# Patient Record
Sex: Female | Born: 1970
Health system: Southern US, Community
[De-identification: ages and names within clinical notes are randomized; demographics above are authoritative.]

## PROBLEM LIST (undated history)

## (undated) DIAGNOSIS — I776 Arteritis, unspecified: Secondary | ICD-10-CM

## (undated) DIAGNOSIS — F419 Anxiety disorder, unspecified: Secondary | ICD-10-CM

## (undated) DIAGNOSIS — T7840XA Allergy, unspecified, initial encounter: Secondary | ICD-10-CM

## (undated) DIAGNOSIS — R011 Cardiac murmur, unspecified: Secondary | ICD-10-CM

## (undated) DIAGNOSIS — Z9889 Other specified postprocedural states: Secondary | ICD-10-CM

## (undated) DIAGNOSIS — R112 Nausea with vomiting, unspecified: Secondary | ICD-10-CM

## (undated) HISTORY — DX: Anxiety disorder, unspecified: F41.9

## (undated) HISTORY — DX: Arteritis, unspecified: I77.6

## (undated) HISTORY — DX: Nausea with vomiting, unspecified: Z98.890

## (undated) HISTORY — PX: EYE SURGERY: SHX253

## (undated) HISTORY — DX: Nausea with vomiting, unspecified: R11.2

## (undated) HISTORY — DX: Allergy, unspecified, initial encounter: T78.40XA

## (undated) HISTORY — DX: Cardiac murmur, unspecified: R01.1

## (undated) HISTORY — PX: WISDOM TOOTH EXTRACTION: SHX21

---

## 2002-09-21 ENCOUNTER — Other Ambulatory Visit: Admission: RE | Admit: 2002-09-21 | Discharge: 2002-09-21 | Payer: Self-pay | Admitting: Obstetrics and Gynecology

## 2002-12-28 ENCOUNTER — Inpatient Hospital Stay (HOSPITAL_COMMUNITY): Admission: AD | Admit: 2002-12-28 | Discharge: 2002-12-28 | Payer: Self-pay | Admitting: Obstetrics and Gynecology

## 2003-02-28 ENCOUNTER — Inpatient Hospital Stay (HOSPITAL_COMMUNITY): Admission: AD | Admit: 2003-02-28 | Discharge: 2003-02-28 | Payer: Self-pay | Admitting: Obstetrics and Gynecology

## 2003-05-28 ENCOUNTER — Inpatient Hospital Stay (HOSPITAL_COMMUNITY): Admission: AD | Admit: 2003-05-28 | Discharge: 2003-05-30 | Payer: Self-pay | Admitting: Obstetrics and Gynecology

## 2003-10-03 ENCOUNTER — Other Ambulatory Visit: Admission: RE | Admit: 2003-10-03 | Discharge: 2003-10-03 | Payer: Self-pay | Admitting: Obstetrics and Gynecology

## 2003-12-18 ENCOUNTER — Emergency Department (HOSPITAL_COMMUNITY): Admission: EM | Admit: 2003-12-18 | Discharge: 2003-12-18 | Payer: Self-pay | Admitting: Emergency Medicine

## 2004-03-28 ENCOUNTER — Emergency Department (HOSPITAL_COMMUNITY): Admission: EM | Admit: 2004-03-28 | Discharge: 2004-03-28 | Payer: Self-pay | Admitting: Emergency Medicine

## 2005-11-17 ENCOUNTER — Other Ambulatory Visit: Admission: RE | Admit: 2005-11-17 | Discharge: 2005-11-17 | Payer: Self-pay | Admitting: Obstetrics and Gynecology

## 2012-02-24 ENCOUNTER — Ambulatory Visit: Payer: Self-pay | Admitting: Obstetrics and Gynecology

## 2012-03-22 ENCOUNTER — Encounter: Payer: Self-pay | Admitting: Obstetrics and Gynecology

## 2012-03-22 ENCOUNTER — Ambulatory Visit: Payer: BC Managed Care – PPO | Admitting: Obstetrics and Gynecology

## 2012-03-22 VITALS — BP 118/70 | Ht 66.0 in | Wt 140.0 lb

## 2012-03-22 DIAGNOSIS — Z01419 Encounter for gynecological examination (general) (routine) without abnormal findings: Secondary | ICD-10-CM

## 2012-03-22 DIAGNOSIS — I776 Arteritis, unspecified: Secondary | ICD-10-CM | POA: Insufficient documentation

## 2012-03-22 NOTE — Progress Notes (Signed)
The patient reports:no complaints   Contraception:Condoms  Last mammogram: 10/02/2007 Normal Last pap: 02/23/2011 Normal  GC/Chlamydia cultures offered: declined HIV/RPR/HbsAg offered:  declined HSV 1 and 2 glycoprotein offered: declined  Menstrual cycle regular and monthly: Yes evey 28 days Menstrual flow normal: Yes lasts 5-7 days   Urinary symptoms: none Normal bowel movements: Yes Reports abuse at home: No:   Subjective:    Gina Melton is a 42 y.o. female, G2P1011, who presents for an annual exam.with no complaints.    History   Social History  . Marital Status: Single    Spouse Name: N/A    Number of Children: N/A  . Years of Education: N/A   Social History Main Topics  . Smoking status: Former Games developer  . Smokeless tobacco: Never Used  . Alcohol Use: No  . Drug Use: No  . Sexually Active: Yes    Birth Control/ Protection: Condom   Other Topics Concern  . Not on file   Social History Narrative  . No narrative on file    Menstrual cycle:   LMP: Patient's last menstrual period was 03/03/2012.           Cycle: normal  The following portions of the patient's history were reviewed and updated as appropriate: allergies, current medications, past family history, past medical history, past social history, past surgical history and problem list.  Review of Systems Pertinent items are noted in HPI. Breast:Negative for breast lump,nipple discharge or nipple retraction Gastrointestinal: Negative for abdominal pain, change in bowel habits or rectal bleeding Urinary:negative   Objective:    BP 118/70  Ht 5\' 6"  (1.676 m)  Wt 140 lb (63.504 kg)  BMI 22.61 kg/m2  LMP 03/03/2012    Weight:  Wt Readings from Last 1 Encounters:  03/22/12 140 lb (63.504 kg)          BMI: Body mass index is 22.61 kg/(m^2).  General Appearance: Alert, appropriate appearance for age. No acute distress HEENT: Grossly normal Neck / Thyroid: Supple, no masses, nodes or enlargement Lungs:  clear to auscultation bilaterally Back: No CVA tenderness Breast Exam: No masses or nodes.No dimpling, nipple retraction or discharge. Cardiovascular: Regular rate and rhythm. S1, S2, no murmur Gastrointestinal: Soft, non-tender, no masses or organomegaly Pelvic Exam: Vulva and vagina appear normal. Bimanual exam reveals normal uterus and adnexa. Rectovaginal: normal rectal, no masses Lymphatic Exam: Non-palpable nodes in neck, clavicular, axillary, or inguinal regions  Skin: no rash or abnormalities Neurologic: Normal gait and speech, no tremor  Psychiatric: Alert and oriented, appropriate affect.   Assessment:    Normal gyn exam    Plan:    Mammogram  pap smear return annually or prn STD screening: declined Contraception:condoms Recent diagnosis of vasculitis discussed Sch MMG @ [facility] Some peri-rectal irritation, rx discussed / pt declined  Silverio Lay MD

## 2012-03-23 LAB — PAP IG W/ RFLX HPV ASCU

## 2012-05-29 ENCOUNTER — Other Ambulatory Visit: Payer: Self-pay

## 2012-05-29 DIAGNOSIS — Z1231 Encounter for screening mammogram for malignant neoplasm of breast: Secondary | ICD-10-CM

## 2012-06-01 ENCOUNTER — Ambulatory Visit
Admission: RE | Admit: 2012-06-01 | Discharge: 2012-06-01 | Disposition: A | Discharge status: Home / Self Care | Payer: BC Managed Care – PPO | Source: Ambulatory Visit

## 2012-06-01 DIAGNOSIS — Z1231 Encounter for screening mammogram for malignant neoplasm of breast: Secondary | ICD-10-CM

## 2012-06-05 ENCOUNTER — Other Ambulatory Visit: Payer: Self-pay | Admitting: Obstetrics and Gynecology

## 2012-06-05 DIAGNOSIS — R928 Other abnormal and inconclusive findings on diagnostic imaging of breast: Secondary | ICD-10-CM

## 2012-06-14 ENCOUNTER — Ambulatory Visit
Admission: RE | Admit: 2012-06-14 | Discharge: 2012-06-14 | Disposition: A | Discharge status: Home / Self Care | Payer: BC Managed Care – PPO | Source: Ambulatory Visit | Attending: Obstetrics and Gynecology | Admitting: Obstetrics and Gynecology

## 2012-06-14 DIAGNOSIS — R928 Other abnormal and inconclusive findings on diagnostic imaging of breast: Secondary | ICD-10-CM

## 2013-04-09 ENCOUNTER — Other Ambulatory Visit: Payer: Self-pay | Admitting: Obstetrics and Gynecology

## 2013-04-09 DIAGNOSIS — Z1231 Encounter for screening mammogram for malignant neoplasm of breast: Secondary | ICD-10-CM

## 2013-06-04 ENCOUNTER — Ambulatory Visit
Admission: RE | Admit: 2013-06-04 | Discharge: 2013-06-04 | Disposition: A | Discharge status: Home / Self Care | Payer: BC Managed Care – PPO | Source: Ambulatory Visit | Attending: Obstetrics and Gynecology | Admitting: Obstetrics and Gynecology

## 2013-06-04 DIAGNOSIS — Z1231 Encounter for screening mammogram for malignant neoplasm of breast: Secondary | ICD-10-CM

## 2013-07-31 ENCOUNTER — Other Ambulatory Visit (HOSPITAL_COMMUNITY): Payer: Self-pay | Admitting: Obstetrics and Gynecology

## 2013-07-31 DIAGNOSIS — Z308 Encounter for other contraceptive management: Secondary | ICD-10-CM

## 2013-09-10 ENCOUNTER — Other Ambulatory Visit (HOSPITAL_COMMUNITY): Payer: Self-pay | Admitting: Obstetrics and Gynecology

## 2013-09-10 ENCOUNTER — Ambulatory Visit (HOSPITAL_COMMUNITY)
Admission: RE | Admit: 2013-09-10 | Discharge: 2013-09-10 | Disposition: A | Discharge status: Home / Self Care | Payer: BC Managed Care – PPO | Source: Ambulatory Visit | Attending: Obstetrics and Gynecology | Admitting: Obstetrics and Gynecology

## 2013-09-10 DIAGNOSIS — Z308 Encounter for other contraceptive management: Secondary | ICD-10-CM | POA: Insufficient documentation

## 2013-09-10 MED ORDER — IOHEXOL 300 MG/ML  SOLN
20.0000 mL | Freq: Once | INTRAMUSCULAR | Status: AC | PRN
  Administered 2013-09-10: 20 mL

## 2013-12-03 ENCOUNTER — Encounter: Payer: Self-pay | Admitting: Obstetrics and Gynecology

## 2016-11-02 ENCOUNTER — Ambulatory Visit (INDEPENDENT_AMBULATORY_CARE_PROVIDER_SITE_OTHER): Payer: Managed Care, Other (non HMO) | Admitting: Neurology

## 2016-11-02 ENCOUNTER — Encounter (INDEPENDENT_AMBULATORY_CARE_PROVIDER_SITE_OTHER): Payer: Self-pay

## 2016-11-02 ENCOUNTER — Encounter: Payer: Self-pay | Admitting: Neurology

## 2016-11-02 VITALS — BP 115/62 | HR 64 | Ht 66.0 in | Wt 144.2 lb

## 2016-11-02 DIAGNOSIS — G5731 Lesion of lateral popliteal nerve, right lower limb: Secondary | ICD-10-CM

## 2016-11-02 NOTE — Progress Notes (Signed)
[facility]    Provider:  Dr Jaynee Eagles Referring Provider: Lahoma Rocker, MD Primary Care Physician:  Donald Prose, MD  CC:  neuropathy  HPI:  Gina Melton is a 46 y.o. female here as a referral from Dr. Kathlene November for Peroneal neuropathy. 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 Past medical history of arthritis, vasculitis, anxiety, uveitis, sicca syndrome, keratitis, tingling in the right foot. The Peroneal sensory neuropathy started in May with numbness, on the top of the foot. 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 She doesn't even notice it, only since she touched it. No pain, no weakness, stable not progressive, No paresthesias just numbness. It is on the right sock. Patient crosses her legs a lot and also sits with her legs flexed which also may compress the peroneal nerve at the fibular head. No back pain or radicular symptoms. No other focal neurologic deficits, associated symptoms, inciting events or modifiable factors.    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 She sitting and ophthalmologist. And rheumatologist. Reviewed notes from [facility]. She is on methotrexate and Humira. She reported tingling in her right dorsal aspect of the foot for last 1-2 months. No decrease in muscle strength or any foot drop or any other areas of neurologic symptoms.  CMP performed with BUN 11, creatinine 0.68. ESR 11. CRP normal. Personally reviewed EMG EEG consultants on [employer] this is an abnormal study with electrophysiologic evidence of a right peroneal  sensory neuropathy, no evidence appeared motor abnormality, or radiculopathy plexopathy or myopathy.    Review of Systems: Patient complains of symptoms per HPI as well as the following symptoms: numbness. Pertinent negatives and positives per HPI. All others negative.   Social History   Social History  . Marital status: Married    Spouse name: N/A  . Number of children: N/A  . Years of education: N/A   Occupational History  . Not on file.   Social History Main Topics  . Smoking status: Former Research scientist (life sciences)  . Smokeless tobacco: Never Used  . Alcohol use No  . Drug use: No  . Sexual activity: Yes    Birth control/ protection: Condom   Other Topics Concern  . Not on file   Social History Narrative  . No narrative on file    Family History  Problem Relation Age of Onset  . Breast cancer Mother   . Cancer Father        lung  . Cancer Maternal Grandfather        colon  . Breast cancer Other   . Diabetes Paternal Grandfather   . Neuropathy Neg Hx     Past Medical History:  Diagnosis Date  . Allergy   . Anxiety    Panic Attacks  . Heart murmur   . PONV (postoperative nausea and vomiting)   . Vasculitis [facility])     Past Surgical History:  Procedure Laterality Date  . EYE SURGERY    . WISDOM TOOTH EXTRACTION      Current Outpatient Prescriptions  Medication Sig Dispense Refill  . calcium citrate-vitamin D 200-200 MG-UNIT TABS Take 1 tablet by mouth daily.    Marland Kitchen  cetirizine (ZYRTEC) 10 MG tablet Take 10 mg by mouth daily.    . Cranberry 400 MG CAPS Take by mouth.    . diclofenac sodium (VOLTAREN) 1 % GEL Voltaren 1 % topical gel  APPLY 3 GRAMS TO 3 LARGE JOINTS UP TO THREE TIMES A DAY AS NEEDED    . escitalopram (LEXAPRO) 10 MG tablet Take 10 mg by mouth daily.    . fish oil-omega-3 fatty acids 1000 MG capsule Take 2 g by mouth 2 (two) times daily.    . folic acid (FOLVITE) 1 MG tablet Take 1 mg by mouth 2 (two) times daily.    Marland Kitchen glucosamine-chondroitin 500-400 MG  tablet Take 1 tablet by mouth 2 (two) times daily.    Marland Kitchen HUMIRA PEN 40 MG/0.8ML PNKT once a week.     . loratadine (CLARITIN) 10 MG tablet Take 10 mg by mouth daily.    . methotrexate (50 MG/ML) 1 g injection Inject 0.8 mg into the vein once a week.    . Multiple Vitamin (MULTIVITAMIN) tablet Take 1 tablet by mouth daily.    . vitamin C (ASCORBIC ACID) 500 MG tablet Take 500 mg by mouth daily.     No current facility-administered medications for this visit.     Allergies as of 11/02/2016 - Review Complete 11/02/2016  Allergen Reaction Noted  . Flagyl [metronidazole] Hives 03/20/2012  . Floxin [ofloxacin] Other (See Comments), Hives, and Itching 03/20/2012  . Other Hives   . Demerol [meperidine] Other (See Comments) 03/20/2012  . Nsaids      Vitals: BP 115/62 (BP Location: Right Arm, Patient Position: Sitting, Cuff Size: Small)   Pulse 64   Ht 5' 6"  (1.676 m)   Wt 144 lb 3.2 oz (65.4 kg)   BMI 23.27 kg/m  Last Weight:  Wt Readings from Last 1 Encounters:  11/02/16 144 lb 3.2 oz (65.4 kg)   Last Height:   Ht Readings from Last 1 Encounters:  11/02/16 5' 6"  (1.676 m)    Physical exam: Exam: Gen: NAD, conversant, well nourised, obese, well groomed                     CV: RRR, no MRG. No Carotid Bruits. No peripheral edema, warm, nontender Eyes: Conjunctivae injection left eye  Neuro: Detailed Neurologic Exam  Speech:    Speech is normal; fluent and spontaneous with normal comprehension.  Cognition:    The patient is oriented to person, place, and time;     recent and remote memory intact;     language fluent;     normal attention, concentration,     fund of knowledge Cranial Nerves:    Left pupil assymetric minimally reactive. Attempted funduscopic exam could not visualize Visual fields are full to finger confrontation. Right eye impairment inversion, left eye impairment of vertical and horizontal eye movements, disconjugate gaze. Trigeminal sensation is intact and the  muscles of mastication are normal. The face is symmetric. The palate elevates in the midline. Hearing intact. Voice is normal. Shoulder shrug is normal. The tongue has normal motion without fasciculations.   Coordination:    No dysmetria  Gait:    No ataxia  Motor Observation:    No asymmetry, no atrophy, and no involuntary movements noted. Tone:    Normal muscle tone.    Posture:    Posture is normal. normal erect    Strength:    Strength is V/V in the upper and lower limbs.  Sensation: Decreased in the right leg in the distribution of the peroneal sensory nerve     Reflex Exam:  DTR's:    Deep tendon reflexes in the upper and lower extremities are normal bilaterally.   Toes:    The toes are downgoing bilaterally.   Clonus:    Clonus is absent.      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 At this time conservative measures should be followed including positioning herself where she does not irritate the peroneal nerve as it does not crossing legs, no need for biopsy at this time continue being treated for her vasculitis with rheumatology, do not wear tight boots or tight shoes. Patient is return to clinic if symptoms worsen. In many cases symptoms will resolve however takes significant time for nerves to regenerate.  Cc: Lahoma Rocker, MD  Sarina Ill, MD  [facility] [address] [place], [place] [address]  Phone [phone] Fax [phone]

## 2016-11-02 NOTE — Patient Instructions (Signed)
Common Peroneal Nerve Entrapment Common peroneal nerve entrapment is a condition that can make it hard to lift a foot. The condition results from pressure on a nerve in the lower leg called the common peroneal nerve. Your common peroneal nerve provides feeling to your outer lower leg and foot. It also supplies the muscles that move your foot and toes upward and outward. What are the causes? This condition may be caused by:  A hard, direct hit to the side of the lower leg.  Swelling from a knee injury.  A break (fracture) in one of the lower leg bones.  Wearing a boot or cast that ends just below the knee.  A growth or cyst near the nerve.  What increases the risk? This condition is more likely to develop in people who play:  Contact sports, such as football or hockey.  Sports in which you wear high and stiff boots, such as skiing.  What are the signs or symptoms? Symptoms of this condition include:  Trouble lifting your foot up.  Tripping often.  Your foot hitting the ground harder than normal as you walk.  Numbness, tingling, or pain in the outside of the knee, outside of the lower leg, and top of the foot.  Sensitivity to pressure on the front or side of the leg.  How is this diagnosed? This condition may be diagnosed based on:  Your symptoms.  Your medical history.  A physical exam.  Tests, such as: ? An X-ray to check the bones of your knee and leg. ? MRI to check tendons that attach to the side of your knee. ? An electromyogram (EMG) to check your nerves.  During your physical exam, your health care provider will check for numbness in your leg and test the strength of your lower leg muscles. He or she may tap the side of your lower leg to see if that causes tingling. How is this treated? Treatment for this condition may include:  Avoiding activities that make symptoms worse.  Using a brace to hold up your foot and toes.  Taking anti-inflammatory pain  medicines to relieve swelling and reduce pain.  Having medicines injected into your ankle joint to reduce pain and swelling.  Physical therapy. This involves doing exercises.  Returning gradually to full activity.  Surgery to take pressure off the nerve. This may be needed if there is no improvement after 2-3 months or if there is a growth pushing on the nerve.  Follow these instructions at home: If you have a brace:  Wear it as told by your health care provider. Remove it only as told by your health care provider.  Loosen the brace if your toes tingle, become numb, or turn cold and blue.  Keep the brace clean.  If the brace is not waterproof: ? Do not let it get wet. ? Cover it with a watertight covering when you take a bath or a shower.  Ask your health care provider when it is safe to drive with a brace on your foot. Activity  Return to your normal activities as told by your health care provider. Ask your health care provider what activities are safe for you.  Do not do any activities that make pain or swelling worse.  Do exercises as told by your health care provider. General instructions  Take over-the-counter and prescription medicines only as told by your health care provider.  Do not put your full weight on your knee until your health care provider   says you can.  Keep all follow-up visits as told by your health care provider. This is important. How is this prevented?  Wear supportive footwear that is appropriate for your athletic activity.  Avoid athletic activities that cause ankle pain or swelling.  Wear protective padding over your lower legs when playing contact sports.  Make sure your boots do not put extra pressure on the area just below your knees.  Do not sit cross-legged for long periods of time. Contact a health care provider if:  Your symptoms do not get better in 2-3 months.  The weakness or numbness in your leg or foot gets worse. This  information is not intended to replace advice given to you by your health care provider. Make sure you discuss any questions you have with your health care provider. Document Released: 01/18/2005 Document Revised: 09/23/2015 Document Reviewed: 12/06/2014 Elsevier Interactive Patient Education  2018 Elsevier Inc.  

## 2018-08-17 DIAGNOSIS — Z3040 Encounter for surveillance of contraceptives, unspecified: Secondary | ICD-10-CM | POA: Diagnosis not present

## 2018-08-17 DIAGNOSIS — Z1231 Encounter for screening mammogram for malignant neoplasm of breast: Secondary | ICD-10-CM | POA: Diagnosis not present

## 2018-08-17 DIAGNOSIS — Z6827 Body mass index (BMI) 27.0-27.9, adult: Secondary | ICD-10-CM | POA: Diagnosis not present

## 2018-08-17 DIAGNOSIS — Z01419 Encounter for gynecological examination (general) (routine) without abnormal findings: Secondary | ICD-10-CM | POA: Diagnosis not present

## 2018-08-29 DIAGNOSIS — E7800 Pure hypercholesterolemia, unspecified: Secondary | ICD-10-CM | POA: Diagnosis not present

## 2018-08-29 DIAGNOSIS — F3181 Bipolar II disorder: Secondary | ICD-10-CM | POA: Diagnosis not present

## 2018-08-29 DIAGNOSIS — F411 Generalized anxiety disorder: Secondary | ICD-10-CM | POA: Diagnosis not present

## 2018-08-29 DIAGNOSIS — Z0000 Encounter for general adult medical examination without abnormal findings: Secondary | ICD-10-CM | POA: Diagnosis not present

## 2018-09-12 DIAGNOSIS — H16102 Unspecified superficial keratitis, left eye: Secondary | ICD-10-CM | POA: Diagnosis not present

## 2018-09-12 DIAGNOSIS — I776 Arteritis, unspecified: Secondary | ICD-10-CM | POA: Diagnosis not present

## 2018-09-12 DIAGNOSIS — H5010 Unspecified exotropia: Secondary | ICD-10-CM | POA: Diagnosis not present

## 2018-10-03 DIAGNOSIS — Z23 Encounter for immunization: Secondary | ICD-10-CM | POA: Diagnosis not present

## 2018-10-03 DIAGNOSIS — Z79899 Other long term (current) drug therapy: Secondary | ICD-10-CM | POA: Diagnosis not present

## 2018-10-03 DIAGNOSIS — H209 Unspecified iridocyclitis: Secondary | ICD-10-CM | POA: Diagnosis not present

## 2018-10-03 DIAGNOSIS — H169 Unspecified keratitis: Secondary | ICD-10-CM | POA: Diagnosis not present

## 2018-10-03 DIAGNOSIS — M3501 Sicca syndrome with keratoconjunctivitis: Secondary | ICD-10-CM | POA: Diagnosis not present

## 2018-11-13 DIAGNOSIS — I776 Arteritis, unspecified: Secondary | ICD-10-CM | POA: Diagnosis not present

## 2018-11-13 DIAGNOSIS — H5010 Unspecified exotropia: Secondary | ICD-10-CM | POA: Diagnosis not present

## 2018-11-13 DIAGNOSIS — H53033 Strabismic amblyopia, bilateral: Secondary | ICD-10-CM | POA: Diagnosis not present

## 2018-11-13 DIAGNOSIS — H16102 Unspecified superficial keratitis, left eye: Secondary | ICD-10-CM | POA: Diagnosis not present

## 2019-01-03 DIAGNOSIS — Z79899 Other long term (current) drug therapy: Secondary | ICD-10-CM | POA: Diagnosis not present

## 2019-01-03 DIAGNOSIS — M3501 Sicca syndrome with keratoconjunctivitis: Secondary | ICD-10-CM | POA: Diagnosis not present

## 2019-01-03 DIAGNOSIS — H169 Unspecified keratitis: Secondary | ICD-10-CM | POA: Diagnosis not present

## 2019-01-03 DIAGNOSIS — H209 Unspecified iridocyclitis: Secondary | ICD-10-CM | POA: Diagnosis not present

## 2019-02-16 DIAGNOSIS — H53033 Strabismic amblyopia, bilateral: Secondary | ICD-10-CM | POA: Diagnosis not present

## 2019-02-16 DIAGNOSIS — H5010 Unspecified exotropia: Secondary | ICD-10-CM | POA: Diagnosis not present

## 2019-02-16 DIAGNOSIS — I776 Arteritis, unspecified: Secondary | ICD-10-CM | POA: Diagnosis not present

## 2019-02-16 DIAGNOSIS — H16102 Unspecified superficial keratitis, left eye: Secondary | ICD-10-CM | POA: Diagnosis not present

## 2019-04-03 DIAGNOSIS — M3501 Sicca syndrome with keratoconjunctivitis: Secondary | ICD-10-CM | POA: Diagnosis not present

## 2019-04-03 DIAGNOSIS — H209 Unspecified iridocyclitis: Secondary | ICD-10-CM | POA: Diagnosis not present

## 2019-04-03 DIAGNOSIS — Z79899 Other long term (current) drug therapy: Secondary | ICD-10-CM | POA: Diagnosis not present

## 2019-04-03 DIAGNOSIS — H169 Unspecified keratitis: Secondary | ICD-10-CM | POA: Diagnosis not present

## 2019-06-22 DIAGNOSIS — I776 Arteritis, unspecified: Secondary | ICD-10-CM | POA: Diagnosis not present

## 2019-06-22 DIAGNOSIS — H2700 Aphakia, unspecified eye: Secondary | ICD-10-CM | POA: Diagnosis not present

## 2019-06-22 DIAGNOSIS — H5010 Unspecified exotropia: Secondary | ICD-10-CM | POA: Diagnosis not present

## 2019-06-22 DIAGNOSIS — H16102 Unspecified superficial keratitis, left eye: Secondary | ICD-10-CM | POA: Diagnosis not present

## 2019-07-04 DIAGNOSIS — H209 Unspecified iridocyclitis: Secondary | ICD-10-CM | POA: Diagnosis not present

## 2019-07-04 DIAGNOSIS — H169 Unspecified keratitis: Secondary | ICD-10-CM | POA: Diagnosis not present

## 2019-07-04 DIAGNOSIS — M3501 Sicca syndrome with keratoconjunctivitis: Secondary | ICD-10-CM | POA: Diagnosis not present

## 2019-07-04 DIAGNOSIS — Z79899 Other long term (current) drug therapy: Secondary | ICD-10-CM | POA: Diagnosis not present

## 2019-08-03 DIAGNOSIS — H16102 Unspecified superficial keratitis, left eye: Secondary | ICD-10-CM | POA: Diagnosis not present

## 2019-08-03 DIAGNOSIS — H5010 Unspecified exotropia: Secondary | ICD-10-CM | POA: Diagnosis not present

## 2019-08-03 DIAGNOSIS — I776 Arteritis, unspecified: Secondary | ICD-10-CM | POA: Diagnosis not present

## 2019-08-13 DIAGNOSIS — Z1231 Encounter for screening mammogram for malignant neoplasm of breast: Secondary | ICD-10-CM | POA: Diagnosis not present

## 2019-08-13 DIAGNOSIS — Z1211 Encounter for screening for malignant neoplasm of colon: Secondary | ICD-10-CM | POA: Diagnosis not present

## 2019-08-13 DIAGNOSIS — Z01419 Encounter for gynecological examination (general) (routine) without abnormal findings: Secondary | ICD-10-CM | POA: Diagnosis not present

## 2019-08-13 DIAGNOSIS — Z124 Encounter for screening for malignant neoplasm of cervix: Secondary | ICD-10-CM | POA: Diagnosis not present

## 2019-08-13 DIAGNOSIS — N841 Polyp of cervix uteri: Secondary | ICD-10-CM | POA: Diagnosis not present

## 2019-10-10 DIAGNOSIS — H169 Unspecified keratitis: Secondary | ICD-10-CM | POA: Diagnosis not present

## 2019-10-10 DIAGNOSIS — H209 Unspecified iridocyclitis: Secondary | ICD-10-CM | POA: Diagnosis not present

## 2019-10-10 DIAGNOSIS — M3501 Sicca syndrome with keratoconjunctivitis: Secondary | ICD-10-CM | POA: Diagnosis not present

## 2019-10-10 DIAGNOSIS — Z79899 Other long term (current) drug therapy: Secondary | ICD-10-CM | POA: Diagnosis not present

## 2019-10-15 DIAGNOSIS — Z0000 Encounter for general adult medical examination without abnormal findings: Secondary | ICD-10-CM | POA: Diagnosis not present

## 2019-10-15 DIAGNOSIS — E7800 Pure hypercholesterolemia, unspecified: Secondary | ICD-10-CM | POA: Diagnosis not present

## 2019-10-15 DIAGNOSIS — F3181 Bipolar II disorder: Secondary | ICD-10-CM | POA: Diagnosis not present

## 2019-10-15 DIAGNOSIS — F411 Generalized anxiety disorder: Secondary | ICD-10-CM | POA: Diagnosis not present

## 2019-11-01 DIAGNOSIS — Z800 Family history of malignant neoplasm of digestive organs: Secondary | ICD-10-CM | POA: Diagnosis not present

## 2019-11-01 DIAGNOSIS — Z1211 Encounter for screening for malignant neoplasm of colon: Secondary | ICD-10-CM | POA: Diagnosis not present

## 2019-11-05 DIAGNOSIS — H16102 Unspecified superficial keratitis, left eye: Secondary | ICD-10-CM | POA: Diagnosis not present

## 2019-11-05 DIAGNOSIS — H53033 Strabismic amblyopia, bilateral: Secondary | ICD-10-CM | POA: Diagnosis not present

## 2019-11-05 DIAGNOSIS — I776 Arteritis, unspecified: Secondary | ICD-10-CM | POA: Diagnosis not present

## 2019-11-05 DIAGNOSIS — H5010 Unspecified exotropia: Secondary | ICD-10-CM | POA: Diagnosis not present

## 2020-01-04 DIAGNOSIS — D125 Benign neoplasm of sigmoid colon: Secondary | ICD-10-CM | POA: Diagnosis not present

## 2020-01-04 DIAGNOSIS — Z1211 Encounter for screening for malignant neoplasm of colon: Secondary | ICD-10-CM | POA: Diagnosis not present

## 2020-01-04 DIAGNOSIS — K635 Polyp of colon: Secondary | ICD-10-CM | POA: Diagnosis not present

## 2020-01-09 DIAGNOSIS — M1990 Unspecified osteoarthritis, unspecified site: Secondary | ICD-10-CM | POA: Diagnosis not present

## 2020-01-09 DIAGNOSIS — H169 Unspecified keratitis: Secondary | ICD-10-CM | POA: Diagnosis not present

## 2020-01-09 DIAGNOSIS — H209 Unspecified iridocyclitis: Secondary | ICD-10-CM | POA: Diagnosis not present

## 2020-01-09 DIAGNOSIS — Z23 Encounter for immunization: Secondary | ICD-10-CM | POA: Diagnosis not present

## 2020-01-09 DIAGNOSIS — Z79899 Other long term (current) drug therapy: Secondary | ICD-10-CM | POA: Diagnosis not present

## 2020-04-08 DIAGNOSIS — Z79899 Other long term (current) drug therapy: Secondary | ICD-10-CM | POA: Diagnosis not present

## 2020-04-08 DIAGNOSIS — H169 Unspecified keratitis: Secondary | ICD-10-CM | POA: Diagnosis not present

## 2020-04-08 DIAGNOSIS — M3501 Sicca syndrome with keratoconjunctivitis: Secondary | ICD-10-CM | POA: Diagnosis not present

## 2020-04-08 DIAGNOSIS — H209 Unspecified iridocyclitis: Secondary | ICD-10-CM | POA: Diagnosis not present

## 2020-04-14 DIAGNOSIS — F319 Bipolar disorder, unspecified: Secondary | ICD-10-CM | POA: Diagnosis not present

## 2020-04-14 DIAGNOSIS — F411 Generalized anxiety disorder: Secondary | ICD-10-CM | POA: Diagnosis not present

## 2020-05-05 DIAGNOSIS — I776 Arteritis, unspecified: Secondary | ICD-10-CM | POA: Diagnosis not present

## 2020-05-05 DIAGNOSIS — H53033 Strabismic amblyopia, bilateral: Secondary | ICD-10-CM | POA: Diagnosis not present

## 2020-05-05 DIAGNOSIS — H16102 Unspecified superficial keratitis, left eye: Secondary | ICD-10-CM | POA: Diagnosis not present

## 2020-05-05 DIAGNOSIS — H5010 Unspecified exotropia: Secondary | ICD-10-CM | POA: Diagnosis not present

## 2020-05-19 DIAGNOSIS — I776 Arteritis, unspecified: Secondary | ICD-10-CM | POA: Diagnosis not present

## 2020-05-19 DIAGNOSIS — H16102 Unspecified superficial keratitis, left eye: Secondary | ICD-10-CM | POA: Diagnosis not present

## 2020-05-19 DIAGNOSIS — H02402 Unspecified ptosis of left eyelid: Secondary | ICD-10-CM | POA: Diagnosis not present

## 2020-07-11 DIAGNOSIS — H209 Unspecified iridocyclitis: Secondary | ICD-10-CM | POA: Diagnosis not present

## 2020-07-11 DIAGNOSIS — Z79899 Other long term (current) drug therapy: Secondary | ICD-10-CM | POA: Diagnosis not present

## 2020-07-11 DIAGNOSIS — M3501 Sicca syndrome with keratoconjunctivitis: Secondary | ICD-10-CM | POA: Diagnosis not present

## 2020-07-11 DIAGNOSIS — H169 Unspecified keratitis: Secondary | ICD-10-CM | POA: Diagnosis not present

## 2020-08-04 ENCOUNTER — Encounter (HOSPITAL_COMMUNITY): Payer: Self-pay | Admitting: Emergency Medicine

## 2020-08-04 ENCOUNTER — Emergency Department (HOSPITAL_COMMUNITY)
Admission: EM | Admit: 2020-08-04 | Discharge: 2020-08-04 | Disposition: A | Discharge status: Home / Self Care | Payer: Worker's Compensation | Attending: Emergency Medicine | Admitting: Emergency Medicine

## 2020-08-04 ENCOUNTER — Other Ambulatory Visit: Payer: Self-pay

## 2020-08-04 ENCOUNTER — Emergency Department (HOSPITAL_COMMUNITY): Payer: Worker's Compensation

## 2020-08-04 DIAGNOSIS — S82891A Other fracture of right lower leg, initial encounter for closed fracture: Secondary | ICD-10-CM

## 2020-08-04 DIAGNOSIS — S92154A Nondisplaced avulsion fracture (chip fracture) of right talus, initial encounter for closed fracture: Secondary | ICD-10-CM | POA: Insufficient documentation

## 2020-08-04 DIAGNOSIS — Y9289 Other specified places as the place of occurrence of the external cause: Secondary | ICD-10-CM | POA: Insufficient documentation

## 2020-08-04 DIAGNOSIS — Z87891 Personal history of nicotine dependence: Secondary | ICD-10-CM | POA: Diagnosis not present

## 2020-08-04 DIAGNOSIS — W19XXXA Unspecified fall, initial encounter: Secondary | ICD-10-CM | POA: Insufficient documentation

## 2020-08-04 DIAGNOSIS — Y990 Civilian activity done for income or pay: Secondary | ICD-10-CM | POA: Insufficient documentation

## 2020-08-04 DIAGNOSIS — S99911A Unspecified injury of right ankle, initial encounter: Secondary | ICD-10-CM | POA: Diagnosis present

## 2020-08-04 MED ORDER — HYDROCODONE-ACETAMINOPHEN 5-325 MG PO TABS: 1.0000 | ORAL_TABLET | ORAL | 0 refills | Status: AC | PRN

## 2020-08-04 NOTE — Discharge Instructions (Addendum)
Take the prescribed medication as directed for severe pain.  If tolerate, can rely on just motrin (up to 800mg  3x daily). Follow-up with Dr. -- call his office for appt (may be closed 7/4 for holiday). Return to the ED for new or worsening symptoms.

## 2020-08-04 NOTE — ED Triage Notes (Signed)
Pt reports R ankle pain. States that she rolled it at work around 445p yesterday and fell. Denies head injury. No LOC. Pt is ambulatory. Endorses swelling.

## 2020-08-04 NOTE — ED Provider Notes (Signed)
[facility]-EMERGENCY DEPT Provider Note   CSN: [id number] Arrival date & time: 08/04/20  0042     History Chief Complaint  Patient presents with   Ankle Pain    MARGARETTE VANNATTER is a 50 y.o. female.  The history is provided by the patient.  Ankle Pain  50 year old female with history of anxiety, presenting to the ED with right ankle pain.  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  There was no head injury or loss of consciousness.  She finished working her shift but with continued pain.  States ankle has become more swollen and now has some bruising.  She remains ambulatory.  Past Medical History:  Diagnosis Date   Allergy    Anxiety    Panic Attacks   Heart murmur    PONV (postoperative nausea and vomiting)    Vasculitis (HCC)     Patient Active Problem List   Diagnosis Date Noted   Vasculitis (HCC) 03/22/2012    Past Surgical History:  Procedure Laterality Date   EYE SURGERY     WISDOM TOOTH EXTRACTION       OB History     Gravida  2   Para  1   Term  1   Preterm      AB  1   Living  1      SAB      IAB      Ectopic      Multiple      Live Births  1           Family History  Problem Relation Age of Onset   Breast cancer Mother    Cancer Father        lung   Cancer Maternal Grandfather        colon   Breast cancer Other    Diabetes Paternal Grandfather    Neuropathy Neg Hx     Social History   Tobacco Use   Smoking status: Former    Pack years: 0.00   Smokeless tobacco: Never  Substance Use Topics   Alcohol use: No   Drug use: No    Home Medications Prior to Admission medications   Medication Sig Start Date End Date Taking? Authorizing Provider  calcium citrate-vitamin D 200-200 MG-UNIT TABS Take 1 tablet by mouth daily.    [provider]  cetirizine (ZYRTEC) 10 MG  tablet Take 10 mg by mouth daily.    [provider]  Cranberry 400 MG CAPS Take by mouth.    [provider]  diclofenac sodium (VOLTAREN) 1 % GEL Voltaren 1 % topical gel  APPLY 3 GRAMS TO 3 LARGE JOINTS UP TO THREE TIMES A DAY AS NEEDED    [provider]  escitalopram (LEXAPRO) 10 MG tablet Take 10 mg by mouth daily.    [provider]  fish oil-omega-3 fatty acids 1000 MG capsule Take 2 g by mouth 2 (two) times daily.    [provider]  folic acid (FOLVITE) 1 MG tablet Take 1 mg by mouth 2 (two) times daily.    [provider]  glucosamine-chondroitin 500-400 MG tablet Take 1 tablet by mouth 2 (two) times daily.    [provider]  HUMIRA PEN 40 MG/0.8ML PNKT once a week.  10/28/16   [provider]  loratadine (CLARITIN) 10 MG tablet Take 10 mg by mouth daily.    [provider]  methotrexate (50 MG/ML) 1 g injection Inject 0.8 mg into the vein once a week.    [provider]  Multiple Vitamin (MULTIVITAMIN) tablet Take 1 tablet by mouth daily.    [provider]  vitamin C (ASCORBIC ACID) 500 MG tablet Take 500 mg by mouth daily.    [provider]    Allergies    Flagyl [metronidazole], Floxin [ofloxacin], Other, Demerol [meperidine], and Nsaids  Review of Systems   Review of Systems  Musculoskeletal:  Positive for arthralgias.  All other systems reviewed and are negative.  Physical Exam Updated Vital Signs BP 132/88   Pulse 80   Temp 99.2 F (37.3 C) (Oral)   Resp 16   SpO2 99%   Physical Exam Vitals and nursing note reviewed.  Constitutional:      Appearance: She is well-developed.  HENT:     Head: Normocephalic and atraumatic.  Eyes:     Conjunctiva/sclera: Conjunctivae normal.     Pupils: Pupils are equal, round, and reactive to light.  Cardiovascular:     Rate and Rhythm: Normal rate and regular rhythm.     Heart sounds: Normal heart sounds.  Pulmonary:      Effort: Pulmonary effort is normal. No respiratory distress.     Breath sounds: Normal breath sounds. No rhonchi.  Abdominal:     General: Bowel sounds are normal.     Palpations: Abdomen is soft.  Musculoskeletal:        General: Normal range of motion.     Cervical back: Normal range of motion.     Comments: Right ankle with noted swelling and bruising along the medial and lateral aspects of the heel, there is tenderness over the lateral malleolus without gross deformity, DP pulse intact, moving toes normally, normal distal sensation and cap refill  Skin:    General: Skin is warm and dry.  Neurological:     Mental Status: She is alert and oriented to person, place, and time.    ED Results / Procedures / Treatments   Labs (all labs ordered are listed, but only abnormal results are displayed) Labs Reviewed - No data to display  EKG None  Radiology DG Ankle Complete Right  Result Date: 08/04/2020 CLINICAL DATA:  Fall, ankle pain/swelling EXAM: RIGHT ANKLE - COMPLETE 3+ VIEW COMPARISON:  None. FINDINGS: Tiny osseous density inferior to the lateral malleolus could reflect an avulsion fracture, although equivocal. Otherwise, no evidence of fracture or dislocation. Mortise is intact. Mild diffuse soft tissue swelling, most prominent laterally. IMPRESSION: Tiny osseous density inferior to the lateral malleolus could reflect in avulsion fracture, equivocal. Mild diffuse soft tissue swelling, most prominent laterally. Electronically Signed   By: Charline Bills M.D.   On: 08/04/2020 01:44    Procedures Procedures   Medications Ordered in ED Medications - No data to display  ED Course  I have reviewed the triage vital signs and the nursing notes.  Pertinent labs & imaging results that were available during my care of the patient were reviewed by me and considered in my medical decision making (see chart for details).    MDM Rules/Calculators/A&P  50 year old female presenting  to the ED with right ankle injury.  She had a mechanical fall at work today, no head injury or loss of consciousness.  Progressive swelling and bruising throughout the remainder of the evening.  Does have swelling on  exam and bruising around medial and lateral aspects of the heel.  There is tenderness over the lateral malleolus without gross deformity.  Her foot is neurovascularly intact.  X-ray with questionable avulsion fracture inferior to lateral malleolus, suspect this is likely acute.  Patient will be placed in cam walker and referred to Ortho for follow-up.  Discussed symptomatic care at home, ice and elevation.  Return here for new concerns.  Final Clinical Impression(s) / ED Diagnoses Final diagnoses:  Closed avulsion fracture of right ankle, initial encounter    Rx / DC Orders ED Discharge Orders          Ordered    HYDROcodone-acetaminophen (NORCO/VICODIN) 5-325 MG tablet  Every 4 hours PRN        08/04/20 0203             Garlon Hatchet, PA-C 08/04/20 0206    Palumbo, April, MD 08/04/20 0221

## 2020-08-05 ENCOUNTER — Telehealth: Payer: Self-pay | Admitting: Orthopaedic Surgery

## 2020-08-05 NOTE — Telephone Encounter (Signed)
Called and scheduled pt to see Bronson Curb next week

## 2020-08-05 NOTE — Telephone Encounter (Signed)
Do you want to work this pt in for this week?

## 2020-08-05 NOTE — Telephone Encounter (Signed)
Pt was seen in [facility] ED for right ankle fracture as we discussed with Autumn H.. Please call pt back after speaking to Dr. Magnus Ivan and call and set appt for pt. Pt phone number is [phone].

## 2020-08-11 ENCOUNTER — Ambulatory Visit (INDEPENDENT_AMBULATORY_CARE_PROVIDER_SITE_OTHER): Payer: Worker's Compensation | Admitting: Physician Assistant

## 2020-08-11 ENCOUNTER — Encounter: Payer: Self-pay | Admitting: Physician Assistant

## 2020-08-11 DIAGNOSIS — S93411D Sprain of calcaneofibular ligament of right ankle, subsequent encounter: Secondary | ICD-10-CM | POA: Diagnosis not present

## 2020-08-11 NOTE — Progress Notes (Signed)
Office Visit Note   Patient: Gina Melton           Date of Birth: January 27, 1971           MRN: [id number] Visit Date: 08/11/2020              Requested by: Deatra James, MD [phone] [address] [place],  [place] [address] PCP: Deatra James, MD   Assessment & Plan: Visit Diagnoses:  1. Sprain of calcaneofibular ligament of right ankle, subsequent encounter     Plan: Right ankle sprain we will continue cam walker boot when up ambulating.  She is weightbearing as tolerated in the cam walker boot.  See her back in 2 weeks for reevaluation most likely at that point we will place her in an ASO brace and consider formal physical therapy.  Questions encouraged and answered at length.  Follow-Up Instructions: Return in about 2 weeks (around 08/25/2020).   Orders:  No orders of the defined types were placed in this encounter.  No orders of the defined types were placed in this encounter.     Procedures: No procedures performed   Clinical Data: No additional findings.   Subjective: Chief Complaint  Patient presents with   Right Ankle - Pain    HPI Gina Melton is a 50 year old female comes in today with right ankle pain.  She was seen in the ER on 08/04/2020.  She states that she rolled her ankle at work on 08/03/2020.  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  She has no prior pain in the ankle.  No prior injuries to the ankle.  She was seen in the ER and evaluated and found to have a small avulsion off of the distal lateral malleolus.  She is placed in a cam walker boot.  She is here today for follow-up.  She has been taking ibuprofen for the pain.  She is wearing a boot when she is up and about except for today when she had to drive. Radiographs dated 08/04/2020 3 views of the right ankle are reviewed and also reviewed with patient.  Calcification off the inferior aspect of the lateral malleolus consistent with avulsion fracture  seen.  Otherwise talus well located within the ankle mortise no diastases.  No acute fractures otherwise. Review of Systems See HPI  Objective: Vital Signs: There were no vitals taken for this visit.  Physical Exam Constitutional:      Appearance: She is normal weight. She is not ill-appearing or diaphoretic.  Pulmonary:     Effort: Pulmonary effort is normal.  Neurological:     Mental Status: She is oriented to person, place, and time.  Psychiatric:        Mood and Affect: Mood normal.    Ortho Exam Right ankle edema over the lateral malleolus.  She has tenderness over the distal portion of the fibula.  She has good dorsiflexion plantarflexion ankle without pain.  No tenderness over the medial malleolus.  No tenderness over the posterior tibial tendon peroneal tendons.  Achilles is intact nontender.  Dorsal pedal pulses 2+.  No significant ecchymosis no skin breakdown. Specialty Comments:  No specialty comments available.  Imaging: No results found.   PMFS History: Patient Active Problem List   Diagnosis Date Noted   Vasculitis (HCC) 03/22/2012   Past Medical History:  Diagnosis Date   Allergy    Anxiety    Panic  Attacks   Heart murmur    PONV (postoperative nausea and vomiting)    Vasculitis (HCC)     Family History  Problem Relation Age of Onset   Breast cancer Mother    Cancer Father        lung   Cancer Maternal Grandfather        colon   Breast cancer Other    Diabetes Paternal Grandfather    Neuropathy Neg Hx     Past Surgical History:  Procedure Laterality Date   EYE SURGERY     WISDOM TOOTH EXTRACTION     Social History   Occupational History   Not on file  Tobacco Use   Smoking status: Former    Pack years: 0.00   Smokeless tobacco: Never  Substance and Sexual Activity   Alcohol use: No   Drug use: No   Sexual activity: Yes    Birth control/protection: Condom

## 2020-08-13 DIAGNOSIS — Z1231 Encounter for screening mammogram for malignant neoplasm of breast: Secondary | ICD-10-CM | POA: Diagnosis not present

## 2020-08-13 DIAGNOSIS — Z1211 Encounter for screening for malignant neoplasm of colon: Secondary | ICD-10-CM | POA: Diagnosis not present

## 2020-08-13 DIAGNOSIS — Z01419 Encounter for gynecological examination (general) (routine) without abnormal findings: Secondary | ICD-10-CM | POA: Diagnosis not present

## 2020-08-13 DIAGNOSIS — Z3040 Encounter for surveillance of contraceptives, unspecified: Secondary | ICD-10-CM | POA: Diagnosis not present

## 2020-08-18 DIAGNOSIS — H5010 Unspecified exotropia: Secondary | ICD-10-CM | POA: Diagnosis not present

## 2020-08-18 DIAGNOSIS — H16102 Unspecified superficial keratitis, left eye: Secondary | ICD-10-CM | POA: Diagnosis not present

## 2020-08-18 DIAGNOSIS — I776 Arteritis, unspecified: Secondary | ICD-10-CM | POA: Diagnosis not present

## 2020-09-02 ENCOUNTER — Ambulatory Visit (INDEPENDENT_AMBULATORY_CARE_PROVIDER_SITE_OTHER): Payer: Worker's Compensation | Admitting: Physician Assistant

## 2020-09-02 ENCOUNTER — Encounter: Payer: Self-pay | Admitting: Physician Assistant

## 2020-09-02 DIAGNOSIS — S93411D Sprain of calcaneofibular ligament of right ankle, subsequent encounter: Secondary | ICD-10-CM

## 2020-09-02 NOTE — Progress Notes (Signed)
                                                       HPI: Gina Melton returns today for follow-up of her right ankle sprain.  She states the ankle still swells and is tight.  She today presents in sandals she states she cannot drive in the boot.  She has been back at work in the boot.  She is taking ibuprofen as needed.  She feels like she is overall slowly improving.  Physical exam: Right ankle she has full dorsiflexion plantarflexion.  Able to invert and evert the foot without significant pain.  There is no ecchymosis erythema or impending rashes or lesions about the right ankle or foot.  She has tenderness over the lateral malleolus distally.  Otherwise ankle is nontender.  Achilles is intact.  Impression: Right ankle sprain  Plan: At this point time we will place her in an ASO brace she is to wear this at all times when up ambulating for the next 2 weeks.  Then she will begin wearing it just whenever she is out of the home.  We will see her back in 1 month and hopefully release her.  She does state that this has been filed under [employer]. as the injury occurred on the job.  Questions were encouraged and answered at length.  She will continue her home exercises for her ankle.

## 2020-09-30 ENCOUNTER — Encounter: Payer: Self-pay | Admitting: Orthopaedic Surgery

## 2020-09-30 ENCOUNTER — Ambulatory Visit (INDEPENDENT_AMBULATORY_CARE_PROVIDER_SITE_OTHER): Payer: BC Managed Care – PPO | Admitting: Orthopaedic Surgery

## 2020-09-30 DIAGNOSIS — S93411D Sprain of calcaneofibular ligament of right ankle, subsequent encounter: Secondary | ICD-10-CM

## 2020-09-30 NOTE — Progress Notes (Signed)
The patient comes in today for continued follow-up as it relates to a right ankle sprain that occurred in a work-related accident.  She is in an ASO and is been wearing it.  She feels like she is doing pretty well.  She said the swelling is gone down and it aches mainly just at night.  Her right injured ankle does still show some swelling when compared to the noninjured left ankle.  Her range of motion is almost full and her ankles ligaments is stable.  She is neurovascular intact.  She understands that she is still in the healing process and I think she is to do quite well with time.  Since this was an injury that occurred on the job, I would like to see her back for final visit in about 6 weeks to make sure she is improved fully.  I did offer her physical therapy but she states she is doing well enough to avoid physical therapy.

## 2020-10-14 DIAGNOSIS — M3501 Sicca syndrome with keratoconjunctivitis: Secondary | ICD-10-CM | POA: Diagnosis not present

## 2020-10-14 DIAGNOSIS — H209 Unspecified iridocyclitis: Secondary | ICD-10-CM | POA: Diagnosis not present

## 2020-10-14 DIAGNOSIS — H169 Unspecified keratitis: Secondary | ICD-10-CM | POA: Diagnosis not present

## 2020-10-14 DIAGNOSIS — Z79899 Other long term (current) drug therapy: Secondary | ICD-10-CM | POA: Diagnosis not present

## 2020-10-15 DIAGNOSIS — F3181 Bipolar II disorder: Secondary | ICD-10-CM | POA: Diagnosis not present

## 2020-10-15 DIAGNOSIS — E7800 Pure hypercholesterolemia, unspecified: Secondary | ICD-10-CM | POA: Diagnosis not present

## 2020-10-15 DIAGNOSIS — F411 Generalized anxiety disorder: Secondary | ICD-10-CM | POA: Diagnosis not present

## 2020-10-15 DIAGNOSIS — Z0000 Encounter for general adult medical examination without abnormal findings: Secondary | ICD-10-CM | POA: Diagnosis not present

## 2020-11-11 ENCOUNTER — Ambulatory Visit (INDEPENDENT_AMBULATORY_CARE_PROVIDER_SITE_OTHER): Payer: BC Managed Care – PPO | Admitting: Physician Assistant

## 2020-11-11 ENCOUNTER — Other Ambulatory Visit: Payer: Self-pay

## 2020-11-11 ENCOUNTER — Encounter: Payer: Self-pay | Admitting: Physician Assistant

## 2020-11-11 DIAGNOSIS — S93411D Sprain of calcaneofibular ligament of right ankle, subsequent encounter: Secondary | ICD-10-CM

## 2020-11-11 NOTE — Progress Notes (Signed)
HPI: Gina Melton returns today for her right ankle.  She again had a right ankle sprain.  Work-related injury.  She states she is doing great.  Denies any mechanical symptoms of the ankle.  She is no longer wearing any supportive brace.  She has no concerns in regards to the ankle.  Physical exam: Right ankle she has full dorsiflexion plantarflexion.  Nontender throughout the ankle.  She has 5/5 strength with inversion eversion of the foot against resistance.  No rashes skin, lesions, ulcers, impending ulcers or ecchymosis of the right ankle.   Impression: Right ankle sprain  Plan: Activities as tolerated.  Follow-up as needed.  She understands follow-up if she has any pain or mechanical symptoms which are reviewed with her.

## 2021-01-13 DIAGNOSIS — H169 Unspecified keratitis: Secondary | ICD-10-CM | POA: Diagnosis not present

## 2021-01-13 DIAGNOSIS — H209 Unspecified iridocyclitis: Secondary | ICD-10-CM | POA: Diagnosis not present

## 2021-01-13 DIAGNOSIS — Z79899 Other long term (current) drug therapy: Secondary | ICD-10-CM | POA: Diagnosis not present

## 2021-01-13 DIAGNOSIS — M3501 Sicca syndrome with keratoconjunctivitis: Secondary | ICD-10-CM | POA: Diagnosis not present

## 2021-02-19 DIAGNOSIS — H16102 Unspecified superficial keratitis, left eye: Secondary | ICD-10-CM | POA: Diagnosis not present

## 2021-02-19 DIAGNOSIS — I776 Arteritis, unspecified: Secondary | ICD-10-CM | POA: Diagnosis not present

## 2021-02-19 DIAGNOSIS — H02402 Unspecified ptosis of left eyelid: Secondary | ICD-10-CM | POA: Diagnosis not present

## 2021-04-14 DIAGNOSIS — H209 Unspecified iridocyclitis: Secondary | ICD-10-CM | POA: Diagnosis not present

## 2021-04-14 DIAGNOSIS — H169 Unspecified keratitis: Secondary | ICD-10-CM | POA: Diagnosis not present

## 2021-04-14 DIAGNOSIS — M3501 Sicca syndrome with keratoconjunctivitis: Secondary | ICD-10-CM | POA: Diagnosis not present

## 2021-04-14 DIAGNOSIS — Z79899 Other long term (current) drug therapy: Secondary | ICD-10-CM | POA: Diagnosis not present

## 2021-07-15 DIAGNOSIS — M3501 Sicca syndrome with keratoconjunctivitis: Secondary | ICD-10-CM | POA: Diagnosis not present

## 2021-07-15 DIAGNOSIS — H209 Unspecified iridocyclitis: Secondary | ICD-10-CM | POA: Diagnosis not present

## 2021-07-15 DIAGNOSIS — M1990 Unspecified osteoarthritis, unspecified site: Secondary | ICD-10-CM | POA: Diagnosis not present

## 2021-07-15 DIAGNOSIS — Z79899 Other long term (current) drug therapy: Secondary | ICD-10-CM | POA: Diagnosis not present

## 2021-08-10 DIAGNOSIS — R300 Dysuria: Secondary | ICD-10-CM | POA: Diagnosis not present

## 2021-08-10 DIAGNOSIS — R399 Unspecified symptoms and signs involving the genitourinary system: Secondary | ICD-10-CM | POA: Diagnosis not present

## 2021-08-10 DIAGNOSIS — H16102 Unspecified superficial keratitis, left eye: Secondary | ICD-10-CM | POA: Diagnosis not present

## 2021-08-10 DIAGNOSIS — I776 Arteritis, unspecified: Secondary | ICD-10-CM | POA: Diagnosis not present

## 2021-08-10 DIAGNOSIS — H43813 Vitreous degeneration, bilateral: Secondary | ICD-10-CM | POA: Diagnosis not present

## 2021-08-10 DIAGNOSIS — R3915 Urgency of urination: Secondary | ICD-10-CM | POA: Diagnosis not present

## 2021-08-10 DIAGNOSIS — R350 Frequency of micturition: Secondary | ICD-10-CM | POA: Diagnosis not present

## 2021-08-10 DIAGNOSIS — H02402 Unspecified ptosis of left eyelid: Secondary | ICD-10-CM | POA: Diagnosis not present

## 2021-08-17 DIAGNOSIS — Z01419 Encounter for gynecological examination (general) (routine) without abnormal findings: Secondary | ICD-10-CM | POA: Diagnosis not present

## 2021-08-17 DIAGNOSIS — Z1211 Encounter for screening for malignant neoplasm of colon: Secondary | ICD-10-CM | POA: Diagnosis not present

## 2021-08-17 DIAGNOSIS — Z1231 Encounter for screening mammogram for malignant neoplasm of breast: Secondary | ICD-10-CM | POA: Diagnosis not present

## 2021-10-15 DIAGNOSIS — M3501 Sicca syndrome with keratoconjunctivitis: Secondary | ICD-10-CM | POA: Diagnosis not present

## 2021-10-15 DIAGNOSIS — M1990 Unspecified osteoarthritis, unspecified site: Secondary | ICD-10-CM | POA: Diagnosis not present

## 2021-10-15 DIAGNOSIS — Z79899 Other long term (current) drug therapy: Secondary | ICD-10-CM | POA: Diagnosis not present

## 2021-10-15 DIAGNOSIS — H209 Unspecified iridocyclitis: Secondary | ICD-10-CM | POA: Diagnosis not present

## 2021-10-20 DIAGNOSIS — F3181 Bipolar II disorder: Secondary | ICD-10-CM | POA: Diagnosis not present

## 2021-10-20 DIAGNOSIS — E7800 Pure hypercholesterolemia, unspecified: Secondary | ICD-10-CM | POA: Diagnosis not present

## 2021-10-20 DIAGNOSIS — J302 Other seasonal allergic rhinitis: Secondary | ICD-10-CM | POA: Diagnosis not present

## 2021-10-20 DIAGNOSIS — F411 Generalized anxiety disorder: Secondary | ICD-10-CM | POA: Diagnosis not present

## 2021-10-20 DIAGNOSIS — Z0000 Encounter for general adult medical examination without abnormal findings: Secondary | ICD-10-CM | POA: Diagnosis not present

## 2022-01-11 DIAGNOSIS — H02402 Unspecified ptosis of left eyelid: Secondary | ICD-10-CM | POA: Diagnosis not present

## 2022-01-11 DIAGNOSIS — H16102 Unspecified superficial keratitis, left eye: Secondary | ICD-10-CM | POA: Diagnosis not present

## 2022-01-11 DIAGNOSIS — H5010 Unspecified exotropia: Secondary | ICD-10-CM | POA: Diagnosis not present

## 2022-01-11 DIAGNOSIS — I776 Arteritis, unspecified: Secondary | ICD-10-CM | POA: Diagnosis not present

## 2022-01-14 DIAGNOSIS — Z23 Encounter for immunization: Secondary | ICD-10-CM | POA: Diagnosis not present

## 2022-01-14 DIAGNOSIS — Z79899 Other long term (current) drug therapy: Secondary | ICD-10-CM | POA: Diagnosis not present

## 2022-01-14 DIAGNOSIS — H209 Unspecified iridocyclitis: Secondary | ICD-10-CM | POA: Diagnosis not present

## 2022-01-14 DIAGNOSIS — M1990 Unspecified osteoarthritis, unspecified site: Secondary | ICD-10-CM | POA: Diagnosis not present

## 2022-01-14 DIAGNOSIS — H169 Unspecified keratitis: Secondary | ICD-10-CM | POA: Diagnosis not present

## 2022-01-19 DIAGNOSIS — H16102 Unspecified superficial keratitis, left eye: Secondary | ICD-10-CM | POA: Diagnosis not present

## 2022-01-19 DIAGNOSIS — H02402 Unspecified ptosis of left eyelid: Secondary | ICD-10-CM | POA: Diagnosis not present

## 2022-01-19 DIAGNOSIS — H5010 Unspecified exotropia: Secondary | ICD-10-CM | POA: Diagnosis not present

## 2022-01-19 DIAGNOSIS — I776 Arteritis, unspecified: Secondary | ICD-10-CM | POA: Diagnosis not present

## 2022-02-02 IMAGING — CR DG ANKLE COMPLETE 3+V*R*
3 series · 3 of 3 positions shown · non-contrast
Comparison: None.

CLINICAL DATA: Fall, ankle pain/swelling

EXAM:
RIGHT ANKLE - COMPLETE 3+ VIEW

[x ankle ap right]
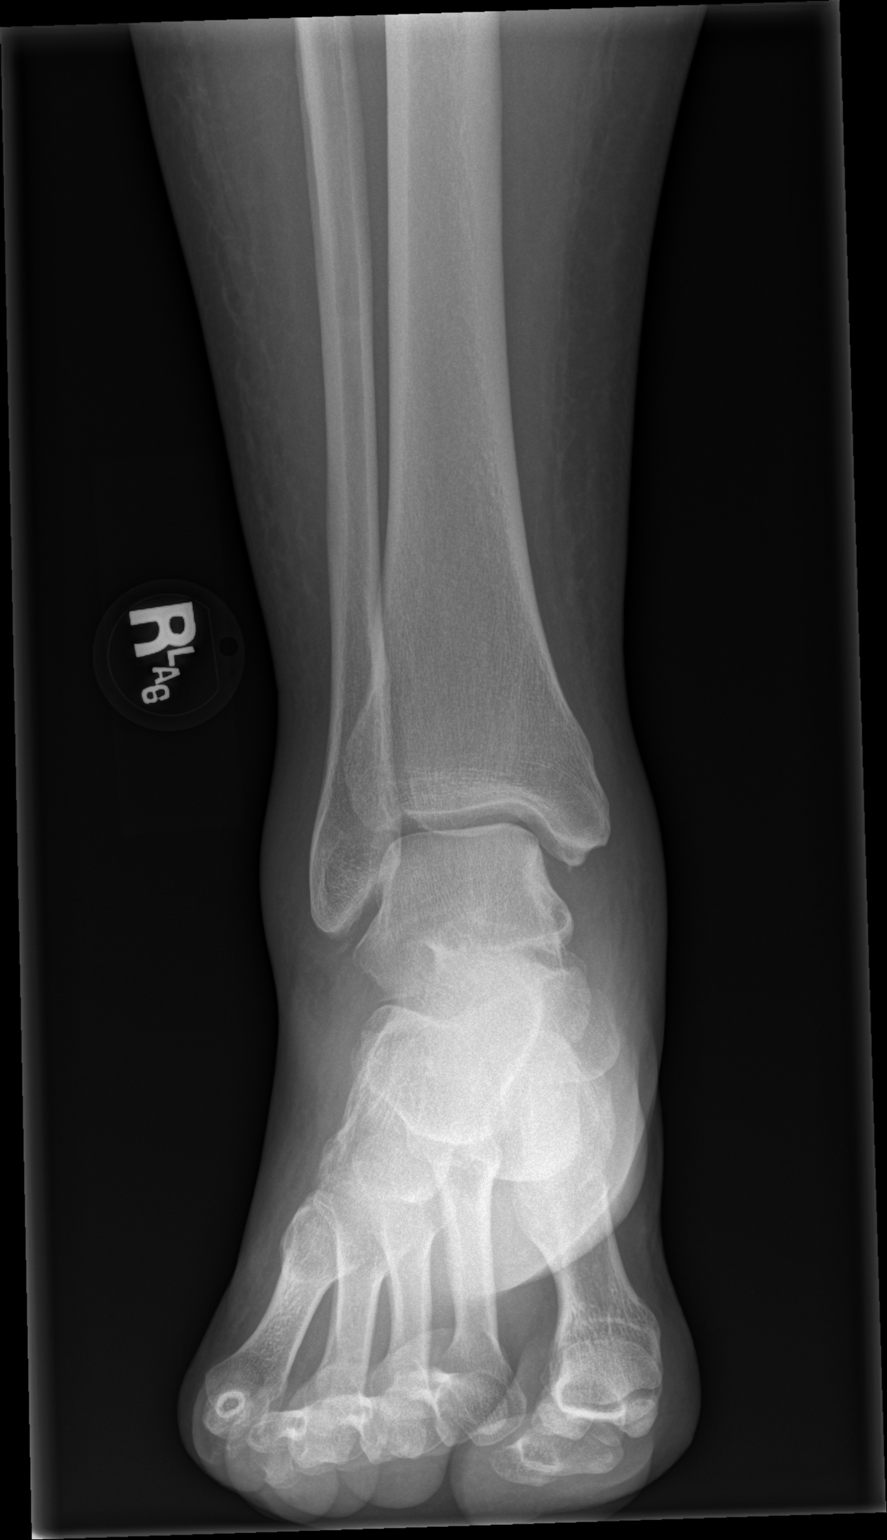

[x ankle obl right]
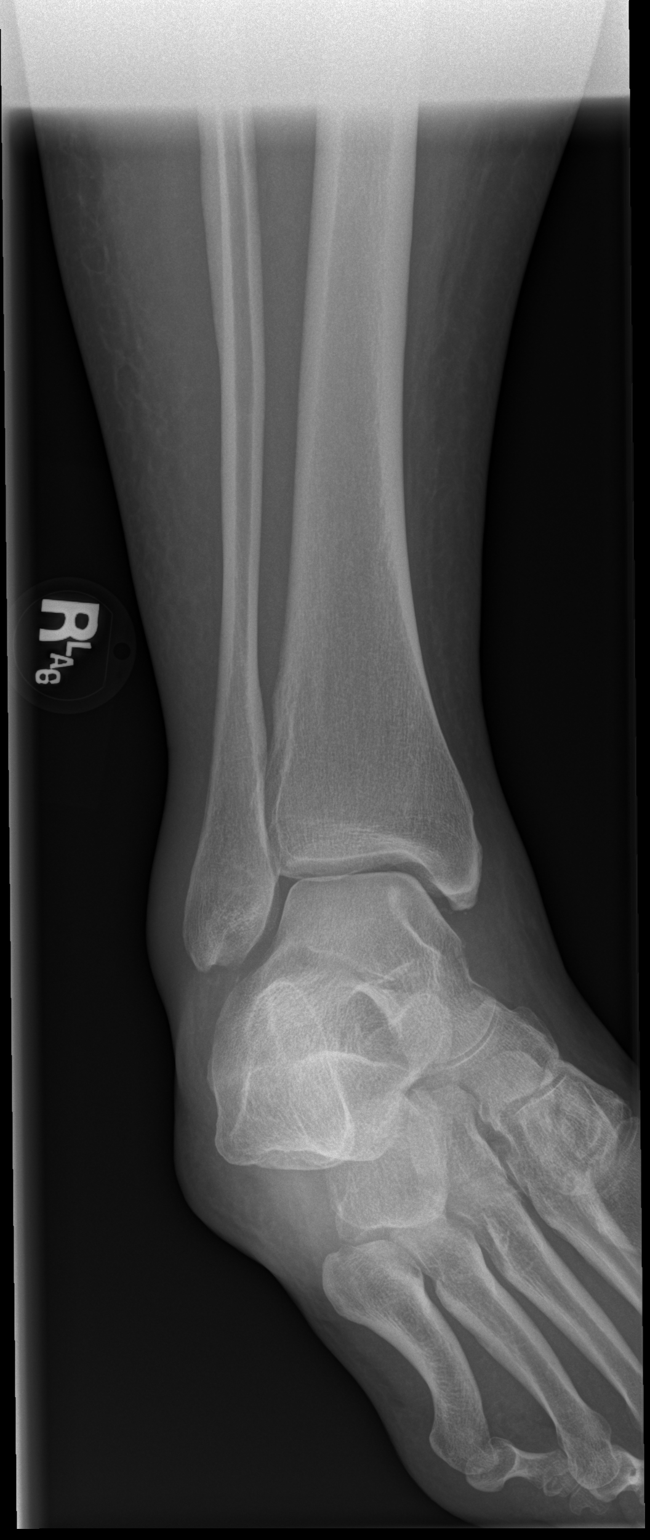

[x ankle lat right]
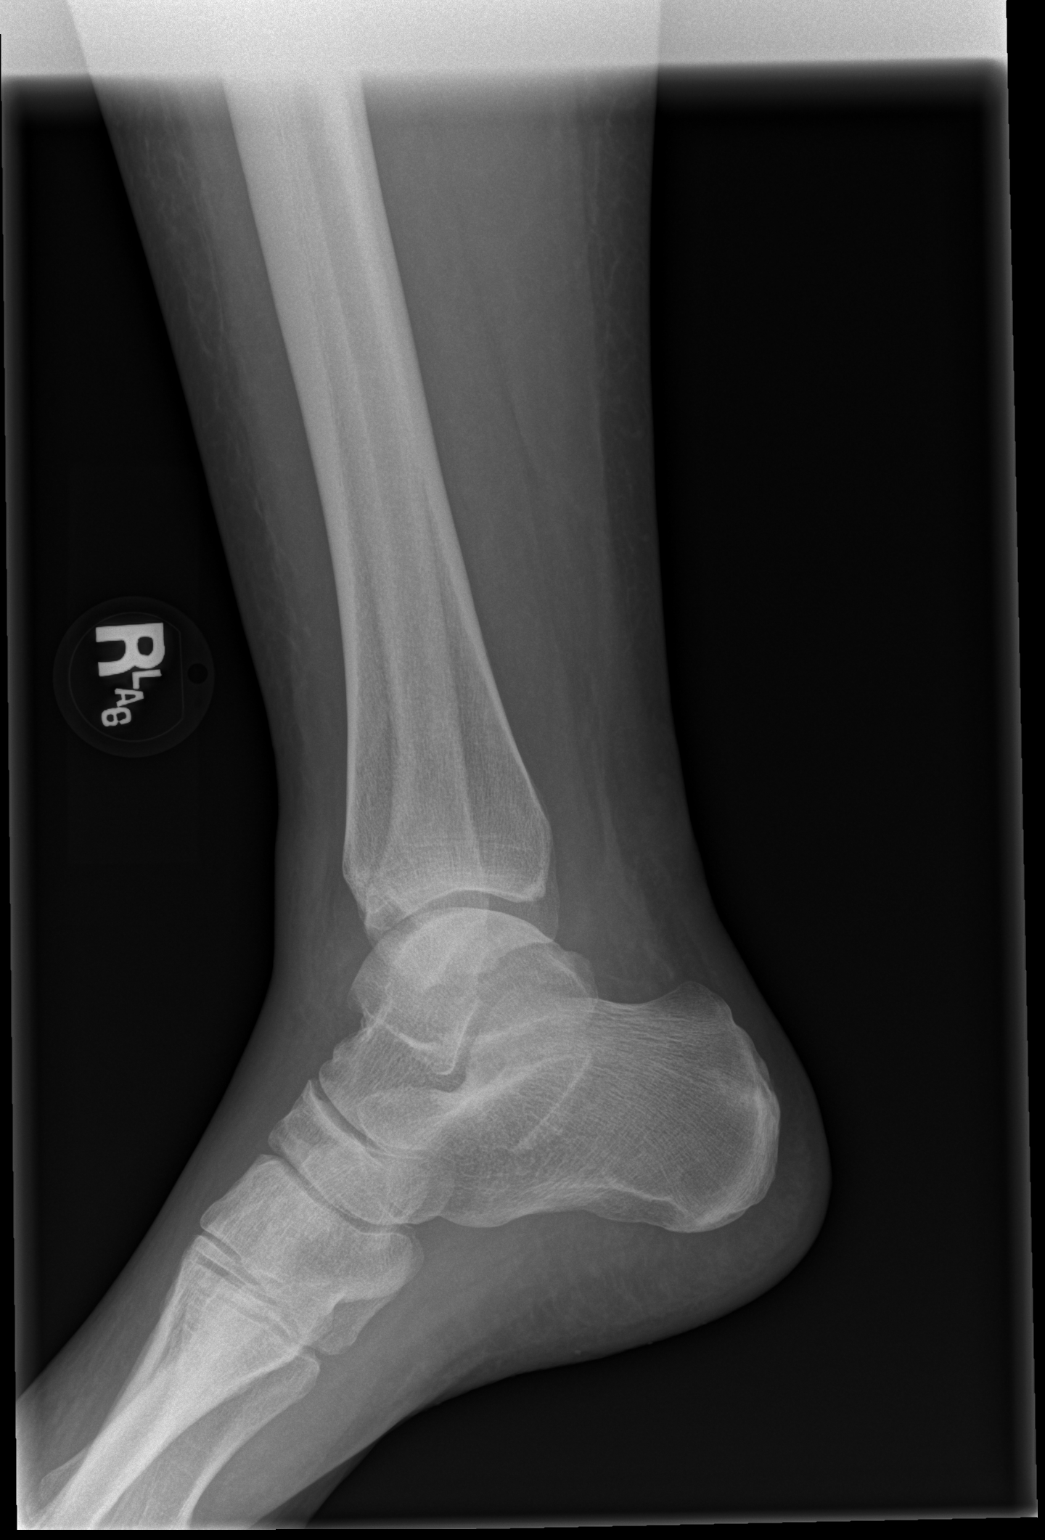

[3 of 3 positions shown; findings below may reference images not displayed]

FINDINGS: Tiny osseous density inferior to the lateral malleolus could reflect
an avulsion fracture, although equivocal.

Otherwise, no evidence of fracture or dislocation.

Mortise is intact.

Mild diffuse soft tissue swelling, most prominent laterally.
IMPRESSION: Tiny osseous density inferior to the lateral malleolus could reflect
in avulsion fracture, equivocal.

Mild diffuse soft tissue swelling, most prominent laterally.

## 2022-02-12 DIAGNOSIS — R058 Other specified cough: Secondary | ICD-10-CM | POA: Diagnosis not present

## 2022-02-12 DIAGNOSIS — J069 Acute upper respiratory infection, unspecified: Secondary | ICD-10-CM | POA: Diagnosis not present

## 2022-02-17 DIAGNOSIS — M064 Inflammatory polyarthropathy: Secondary | ICD-10-CM | POA: Diagnosis not present

## 2022-03-15 DIAGNOSIS — M79602 Pain in left arm: Secondary | ICD-10-CM | POA: Diagnosis not present

## 2022-03-15 DIAGNOSIS — M542 Cervicalgia: Secondary | ICD-10-CM | POA: Diagnosis not present

## 2022-03-24 DIAGNOSIS — H1032 Unspecified acute conjunctivitis, left eye: Secondary | ICD-10-CM | POA: Diagnosis not present

## 2022-04-14 DIAGNOSIS — Z79899 Other long term (current) drug therapy: Secondary | ICD-10-CM | POA: Diagnosis not present

## 2022-04-14 DIAGNOSIS — H169 Unspecified keratitis: Secondary | ICD-10-CM | POA: Diagnosis not present

## 2022-04-14 DIAGNOSIS — H209 Unspecified iridocyclitis: Secondary | ICD-10-CM | POA: Diagnosis not present

## 2022-04-14 DIAGNOSIS — M1990 Unspecified osteoarthritis, unspecified site: Secondary | ICD-10-CM | POA: Diagnosis not present

## 2022-04-20 DIAGNOSIS — H16102 Unspecified superficial keratitis, left eye: Secondary | ICD-10-CM | POA: Diagnosis not present

## 2022-04-20 DIAGNOSIS — I776 Arteritis, unspecified: Secondary | ICD-10-CM | POA: Diagnosis not present

## 2022-04-20 DIAGNOSIS — H02402 Unspecified ptosis of left eyelid: Secondary | ICD-10-CM | POA: Diagnosis not present

## 2022-08-04 DIAGNOSIS — H209 Unspecified iridocyclitis: Secondary | ICD-10-CM | POA: Diagnosis not present

## 2022-08-04 DIAGNOSIS — M1990 Unspecified osteoarthritis, unspecified site: Secondary | ICD-10-CM | POA: Diagnosis not present

## 2022-08-04 DIAGNOSIS — H169 Unspecified keratitis: Secondary | ICD-10-CM | POA: Diagnosis not present

## 2022-08-04 DIAGNOSIS — Z79899 Other long term (current) drug therapy: Secondary | ICD-10-CM | POA: Diagnosis not present

## 2022-08-24 DIAGNOSIS — I776 Arteritis, unspecified: Secondary | ICD-10-CM | POA: Diagnosis not present

## 2022-08-24 DIAGNOSIS — H16102 Unspecified superficial keratitis, left eye: Secondary | ICD-10-CM | POA: Diagnosis not present

## 2022-08-24 DIAGNOSIS — H5010 Unspecified exotropia: Secondary | ICD-10-CM | POA: Diagnosis not present

## 2022-09-21 DIAGNOSIS — Z139 Encounter for screening, unspecified: Secondary | ICD-10-CM | POA: Diagnosis not present

## 2022-09-21 DIAGNOSIS — L989 Disorder of the skin and subcutaneous tissue, unspecified: Secondary | ICD-10-CM | POA: Diagnosis not present

## 2022-09-21 DIAGNOSIS — Z1231 Encounter for screening mammogram for malignant neoplasm of breast: Secondary | ICD-10-CM | POA: Diagnosis not present

## 2022-09-21 DIAGNOSIS — Z01419 Encounter for gynecological examination (general) (routine) without abnormal findings: Secondary | ICD-10-CM | POA: Diagnosis not present

## 2022-10-25 DIAGNOSIS — L989 Disorder of the skin and subcutaneous tissue, unspecified: Secondary | ICD-10-CM | POA: Diagnosis not present

## 2022-10-26 DIAGNOSIS — E7800 Pure hypercholesterolemia, unspecified: Secondary | ICD-10-CM | POA: Diagnosis not present

## 2022-10-26 DIAGNOSIS — J302 Other seasonal allergic rhinitis: Secondary | ICD-10-CM | POA: Diagnosis not present

## 2022-10-26 DIAGNOSIS — Z0000 Encounter for general adult medical examination without abnormal findings: Secondary | ICD-10-CM | POA: Diagnosis not present

## 2022-10-26 DIAGNOSIS — F411 Generalized anxiety disorder: Secondary | ICD-10-CM | POA: Diagnosis not present

## 2022-10-26 DIAGNOSIS — Z23 Encounter for immunization: Secondary | ICD-10-CM | POA: Diagnosis not present

## 2022-10-26 DIAGNOSIS — F3181 Bipolar II disorder: Secondary | ICD-10-CM | POA: Diagnosis not present

## 2022-11-04 DIAGNOSIS — H209 Unspecified iridocyclitis: Secondary | ICD-10-CM | POA: Diagnosis not present

## 2022-11-04 DIAGNOSIS — M1990 Unspecified osteoarthritis, unspecified site: Secondary | ICD-10-CM | POA: Diagnosis not present

## 2022-11-04 DIAGNOSIS — Z79899 Other long term (current) drug therapy: Secondary | ICD-10-CM | POA: Diagnosis not present

## 2022-11-04 DIAGNOSIS — H169 Unspecified keratitis: Secondary | ICD-10-CM | POA: Diagnosis not present

## 2022-12-21 DIAGNOSIS — E7800 Pure hypercholesterolemia, unspecified: Secondary | ICD-10-CM | POA: Diagnosis not present

## 2023-01-20 DIAGNOSIS — B309 Viral conjunctivitis, unspecified: Secondary | ICD-10-CM | POA: Diagnosis not present

## 2023-01-24 DIAGNOSIS — B309 Viral conjunctivitis, unspecified: Secondary | ICD-10-CM | POA: Diagnosis not present

## 2023-01-27 DIAGNOSIS — L259 Unspecified contact dermatitis, unspecified cause: Secondary | ICD-10-CM | POA: Diagnosis not present

## 2023-02-09 DIAGNOSIS — H169 Unspecified keratitis: Secondary | ICD-10-CM | POA: Diagnosis not present

## 2023-02-09 DIAGNOSIS — M1990 Unspecified osteoarthritis, unspecified site: Secondary | ICD-10-CM | POA: Diagnosis not present

## 2023-02-09 DIAGNOSIS — Z79899 Other long term (current) drug therapy: Secondary | ICD-10-CM | POA: Diagnosis not present

## 2023-02-09 DIAGNOSIS — H209 Unspecified iridocyclitis: Secondary | ICD-10-CM | POA: Diagnosis not present

## 2023-03-18 DIAGNOSIS — R21 Rash and other nonspecific skin eruption: Secondary | ICD-10-CM | POA: Diagnosis not present

## 2023-07-19 ENCOUNTER — Other Ambulatory Visit: Payer: Self-pay | Admitting: Obstetrics and Gynecology

## 2023-07-19 DIAGNOSIS — Z1231 Encounter for screening mammogram for malignant neoplasm of breast: Secondary | ICD-10-CM

## 2023-10-31 ENCOUNTER — Other Ambulatory Visit: Payer: Self-pay | Admitting: Obstetrics and Gynecology

## 2023-10-31 DIAGNOSIS — Z1231 Encounter for screening mammogram for malignant neoplasm of breast: Secondary | ICD-10-CM

## 2023-11-08 ENCOUNTER — Ambulatory Visit
Admission: RE | Admit: 2023-11-08 | Discharge: 2023-11-08 | Disposition: A | Discharge status: Home / Self Care | Source: Ambulatory Visit | Attending: Obstetrics and Gynecology | Admitting: Obstetrics and Gynecology

## 2023-11-08 DIAGNOSIS — Z1231 Encounter for screening mammogram for malignant neoplasm of breast: Secondary | ICD-10-CM
